# Patient Record
Sex: Female | Born: 1945 | Race: White | Hispanic: No | Smoking: Never smoker
Health system: Southern US, Community
[De-identification: ages and names within clinical notes are randomized; demographics above are authoritative.]

## PROBLEM LIST (undated history)

## (undated) DIAGNOSIS — N6019 Diffuse cystic mastopathy of unspecified breast: Secondary | ICD-10-CM

## (undated) HISTORY — PX: BREAST CYST EXCISION: SHX579

## (undated) HISTORY — PX: BREAST CYST ASPIRATION: SHX578

---

## 2016-03-31 DIAGNOSIS — H524 Presbyopia: Secondary | ICD-10-CM | POA: Diagnosis not present

## 2016-06-30 DIAGNOSIS — Z1231 Encounter for screening mammogram for malignant neoplasm of breast: Secondary | ICD-10-CM | POA: Diagnosis not present

## 2016-08-31 DIAGNOSIS — E782 Mixed hyperlipidemia: Secondary | ICD-10-CM | POA: Diagnosis not present

## 2016-08-31 DIAGNOSIS — I10 Essential (primary) hypertension: Secondary | ICD-10-CM | POA: Diagnosis not present

## 2016-08-31 DIAGNOSIS — E559 Vitamin D deficiency, unspecified: Secondary | ICD-10-CM | POA: Diagnosis not present

## 2016-08-31 DIAGNOSIS — M8580 Other specified disorders of bone density and structure, unspecified site: Secondary | ICD-10-CM | POA: Diagnosis not present

## 2017-03-01 DIAGNOSIS — Z0001 Encounter for general adult medical examination with abnormal findings: Secondary | ICD-10-CM | POA: Diagnosis not present

## 2017-03-01 DIAGNOSIS — E559 Vitamin D deficiency, unspecified: Secondary | ICD-10-CM | POA: Diagnosis not present

## 2017-03-01 DIAGNOSIS — E784 Other hyperlipidemia: Secondary | ICD-10-CM | POA: Diagnosis not present

## 2017-03-01 DIAGNOSIS — I10 Essential (primary) hypertension: Secondary | ICD-10-CM | POA: Diagnosis not present

## 2017-03-01 DIAGNOSIS — N029 Recurrent and persistent hematuria with unspecified morphologic changes: Secondary | ICD-10-CM | POA: Diagnosis not present

## 2017-03-01 DIAGNOSIS — L84 Corns and callosities: Secondary | ICD-10-CM | POA: Diagnosis not present

## 2017-03-03 DIAGNOSIS — M859 Disorder of bone density and structure, unspecified: Secondary | ICD-10-CM | POA: Diagnosis not present

## 2017-03-04 DIAGNOSIS — M779 Enthesopathy, unspecified: Secondary | ICD-10-CM | POA: Diagnosis not present

## 2017-03-04 DIAGNOSIS — M2011 Hallux valgus (acquired), right foot: Secondary | ICD-10-CM | POA: Diagnosis not present

## 2017-03-08 DIAGNOSIS — R948 Abnormal results of function studies of other organs and systems: Secondary | ICD-10-CM | POA: Diagnosis not present

## 2017-03-31 DIAGNOSIS — J339 Nasal polyp, unspecified: Secondary | ICD-10-CM | POA: Diagnosis not present

## 2017-04-05 DIAGNOSIS — H524 Presbyopia: Secondary | ICD-10-CM | POA: Diagnosis not present

## 2017-05-05 DIAGNOSIS — B353 Tinea pedis: Secondary | ICD-10-CM | POA: Diagnosis not present

## 2017-05-05 DIAGNOSIS — I10 Essential (primary) hypertension: Secondary | ICD-10-CM | POA: Diagnosis not present

## 2017-07-01 DIAGNOSIS — Z1231 Encounter for screening mammogram for malignant neoplasm of breast: Secondary | ICD-10-CM | POA: Diagnosis not present

## 2017-11-08 DIAGNOSIS — Z0000 Encounter for general adult medical examination without abnormal findings: Secondary | ICD-10-CM | POA: Diagnosis not present

## 2017-12-19 DIAGNOSIS — E785 Hyperlipidemia, unspecified: Secondary | ICD-10-CM | POA: Diagnosis not present

## 2017-12-19 DIAGNOSIS — R740 Nonspecific elevation of levels of transaminase and lactic acid dehydrogenase [LDH]: Secondary | ICD-10-CM | POA: Diagnosis not present

## 2017-12-19 DIAGNOSIS — I10 Essential (primary) hypertension: Secondary | ICD-10-CM | POA: Diagnosis not present

## 2017-12-19 DIAGNOSIS — J339 Nasal polyp, unspecified: Secondary | ICD-10-CM | POA: Diagnosis not present

## 2018-03-03 ENCOUNTER — Other Ambulatory Visit: Payer: Self-pay | Admitting: Family Medicine

## 2018-03-03 DIAGNOSIS — Z1389 Encounter for screening for other disorder: Secondary | ICD-10-CM | POA: Diagnosis not present

## 2018-03-03 DIAGNOSIS — Z0000 Encounter for general adult medical examination without abnormal findings: Secondary | ICD-10-CM | POA: Diagnosis not present

## 2018-03-03 DIAGNOSIS — Z139 Encounter for screening, unspecified: Secondary | ICD-10-CM

## 2018-03-03 DIAGNOSIS — E785 Hyperlipidemia, unspecified: Secondary | ICD-10-CM | POA: Diagnosis not present

## 2018-03-03 DIAGNOSIS — I10 Essential (primary) hypertension: Secondary | ICD-10-CM | POA: Diagnosis not present

## 2018-04-26 DIAGNOSIS — H02831 Dermatochalasis of right upper eyelid: Secondary | ICD-10-CM | POA: Diagnosis not present

## 2018-04-26 DIAGNOSIS — H2511 Age-related nuclear cataract, right eye: Secondary | ICD-10-CM | POA: Diagnosis not present

## 2018-04-26 DIAGNOSIS — H2512 Age-related nuclear cataract, left eye: Secondary | ICD-10-CM | POA: Diagnosis not present

## 2018-05-31 DIAGNOSIS — J339 Nasal polyp, unspecified: Secondary | ICD-10-CM | POA: Diagnosis not present

## 2018-07-03 ENCOUNTER — Ambulatory Visit: Payer: Self-pay

## 2018-07-05 ENCOUNTER — Ambulatory Visit
Admission: RE | Admit: 2018-07-05 | Discharge: 2018-07-05 | Disposition: A | Discharge status: Home / Self Care | Payer: Medicare Other | Source: Ambulatory Visit | Attending: Family Medicine | Admitting: Family Medicine

## 2018-07-05 ENCOUNTER — Encounter (INDEPENDENT_AMBULATORY_CARE_PROVIDER_SITE_OTHER): Payer: Self-pay

## 2018-07-05 DIAGNOSIS — Z139 Encounter for screening, unspecified: Secondary | ICD-10-CM

## 2018-07-05 DIAGNOSIS — Z1231 Encounter for screening mammogram for malignant neoplasm of breast: Secondary | ICD-10-CM | POA: Diagnosis not present

## 2018-07-05 HISTORY — DX: Diffuse cystic mastopathy of unspecified breast: N60.19

## 2018-07-13 DIAGNOSIS — H2511 Age-related nuclear cataract, right eye: Secondary | ICD-10-CM | POA: Diagnosis not present

## 2018-07-13 DIAGNOSIS — H25811 Combined forms of age-related cataract, right eye: Secondary | ICD-10-CM | POA: Diagnosis not present

## 2018-07-27 DIAGNOSIS — H25812 Combined forms of age-related cataract, left eye: Secondary | ICD-10-CM | POA: Diagnosis not present

## 2018-07-27 DIAGNOSIS — H2512 Age-related nuclear cataract, left eye: Secondary | ICD-10-CM | POA: Diagnosis not present

## 2018-09-04 DIAGNOSIS — E785 Hyperlipidemia, unspecified: Secondary | ICD-10-CM | POA: Diagnosis not present

## 2018-09-04 DIAGNOSIS — I10 Essential (primary) hypertension: Secondary | ICD-10-CM | POA: Diagnosis not present

## 2018-11-29 DIAGNOSIS — Z0000 Encounter for general adult medical examination without abnormal findings: Secondary | ICD-10-CM | POA: Diagnosis not present

## 2019-01-29 DIAGNOSIS — S39012A Strain of muscle, fascia and tendon of lower back, initial encounter: Secondary | ICD-10-CM | POA: Diagnosis not present

## 2019-03-22 DIAGNOSIS — I10 Essential (primary) hypertension: Secondary | ICD-10-CM | POA: Diagnosis not present

## 2019-03-22 DIAGNOSIS — Z0000 Encounter for general adult medical examination without abnormal findings: Secondary | ICD-10-CM | POA: Diagnosis not present

## 2019-03-22 DIAGNOSIS — M8588 Other specified disorders of bone density and structure, other site: Secondary | ICD-10-CM | POA: Diagnosis not present

## 2019-03-22 DIAGNOSIS — E785 Hyperlipidemia, unspecified: Secondary | ICD-10-CM | POA: Diagnosis not present

## 2019-03-26 ENCOUNTER — Other Ambulatory Visit: Payer: Self-pay | Admitting: Physician Assistant

## 2019-03-26 DIAGNOSIS — M8580 Other specified disorders of bone density and structure, unspecified site: Secondary | ICD-10-CM

## 2019-03-27 ENCOUNTER — Other Ambulatory Visit: Payer: Self-pay | Admitting: Family Medicine

## 2019-03-27 DIAGNOSIS — Z1231 Encounter for screening mammogram for malignant neoplasm of breast: Secondary | ICD-10-CM

## 2019-07-10 ENCOUNTER — Other Ambulatory Visit: Payer: Medicare Other

## 2019-07-10 ENCOUNTER — Ambulatory Visit
Admission: RE | Admit: 2019-07-10 | Discharge: 2019-07-10 | Disposition: A | Discharge status: Home / Self Care | Payer: Medicare Other | Source: Ambulatory Visit | Attending: Family Medicine | Admitting: Family Medicine

## 2019-07-10 ENCOUNTER — Other Ambulatory Visit: Payer: Self-pay

## 2019-07-10 DIAGNOSIS — Z1231 Encounter for screening mammogram for malignant neoplasm of breast: Secondary | ICD-10-CM | POA: Diagnosis not present

## 2019-09-11 DIAGNOSIS — Z23 Encounter for immunization: Secondary | ICD-10-CM | POA: Diagnosis not present

## 2019-10-02 DIAGNOSIS — Z1159 Encounter for screening for other viral diseases: Secondary | ICD-10-CM | POA: Diagnosis not present

## 2019-10-02 DIAGNOSIS — E785 Hyperlipidemia, unspecified: Secondary | ICD-10-CM | POA: Diagnosis not present

## 2019-10-02 DIAGNOSIS — I10 Essential (primary) hypertension: Secondary | ICD-10-CM | POA: Diagnosis not present

## 2019-10-02 DIAGNOSIS — M25562 Pain in left knee: Secondary | ICD-10-CM | POA: Diagnosis not present

## 2019-10-09 DIAGNOSIS — Z0000 Encounter for general adult medical examination without abnormal findings: Secondary | ICD-10-CM | POA: Diagnosis not present

## 2019-10-09 DIAGNOSIS — E785 Hyperlipidemia, unspecified: Secondary | ICD-10-CM | POA: Diagnosis not present

## 2019-10-09 DIAGNOSIS — I10 Essential (primary) hypertension: Secondary | ICD-10-CM | POA: Diagnosis not present

## 2020-01-10 ENCOUNTER — Ambulatory Visit: Payer: Medicare Other

## 2020-01-15 ENCOUNTER — Ambulatory Visit: Payer: Medicare Other

## 2020-01-19 ENCOUNTER — Ambulatory Visit: Payer: Medicare Other

## 2020-04-01 DIAGNOSIS — E785 Hyperlipidemia, unspecified: Secondary | ICD-10-CM | POA: Diagnosis not present

## 2020-04-01 DIAGNOSIS — Z0000 Encounter for general adult medical examination without abnormal findings: Secondary | ICD-10-CM | POA: Diagnosis not present

## 2020-04-01 DIAGNOSIS — I10 Essential (primary) hypertension: Secondary | ICD-10-CM | POA: Diagnosis not present

## 2020-04-01 DIAGNOSIS — M8588 Other specified disorders of bone density and structure, other site: Secondary | ICD-10-CM | POA: Diagnosis not present

## 2020-05-26 ENCOUNTER — Other Ambulatory Visit: Payer: Self-pay | Admitting: Family Medicine

## 2020-05-26 DIAGNOSIS — Z1231 Encounter for screening mammogram for malignant neoplasm of breast: Secondary | ICD-10-CM

## 2020-07-15 ENCOUNTER — Other Ambulatory Visit: Payer: Self-pay

## 2020-07-15 ENCOUNTER — Ambulatory Visit
Admission: RE | Admit: 2020-07-15 | Discharge: 2020-07-15 | Disposition: A | Discharge status: Home / Self Care | Payer: Medicare Other | Source: Ambulatory Visit | Attending: Family Medicine | Admitting: Family Medicine

## 2020-07-15 DIAGNOSIS — Z1231 Encounter for screening mammogram for malignant neoplasm of breast: Secondary | ICD-10-CM

## 2020-08-08 DIAGNOSIS — H524 Presbyopia: Secondary | ICD-10-CM | POA: Diagnosis not present

## 2020-10-18 ENCOUNTER — Other Ambulatory Visit: Payer: Self-pay

## 2020-10-18 DIAGNOSIS — Z90710 Acquired absence of both cervix and uterus: Secondary | ICD-10-CM | POA: Diagnosis not present

## 2020-10-18 DIAGNOSIS — R109 Unspecified abdominal pain: Secondary | ICD-10-CM | POA: Insufficient documentation

## 2020-10-18 DIAGNOSIS — N132 Hydronephrosis with renal and ureteral calculous obstruction: Secondary | ICD-10-CM | POA: Diagnosis not present

## 2020-10-18 NOTE — ED Triage Notes (Signed)
Pt is saying she has been having severe pain in her left lower quadrant and has been having blood in urine over 2 weeks now Pt said vomiting. Pt is in significant pain. Pt is going around to her left lower back

## 2020-10-19 ENCOUNTER — Emergency Department (HOSPITAL_COMMUNITY)
Admission: EM | Admit: 2020-10-19 | Discharge: 2020-10-19 | Disposition: A | Discharge status: Home / Self Care | Payer: Medicare Other | Attending: Emergency Medicine | Admitting: Emergency Medicine

## 2020-10-19 ENCOUNTER — Emergency Department (HOSPITAL_COMMUNITY): Payer: Medicare Other

## 2020-10-19 DIAGNOSIS — Z90710 Acquired absence of both cervix and uterus: Secondary | ICD-10-CM | POA: Diagnosis not present

## 2020-10-19 DIAGNOSIS — R109 Unspecified abdominal pain: Secondary | ICD-10-CM

## 2020-10-19 DIAGNOSIS — N132 Hydronephrosis with renal and ureteral calculous obstruction: Secondary | ICD-10-CM | POA: Diagnosis not present

## 2020-10-19 LAB — URINALYSIS, ROUTINE W REFLEX MICROSCOPIC
Bacteria, UA: NONE SEEN
Bilirubin Urine: NEGATIVE
Glucose, UA: 150 mg/dL — AB
Ketones, ur: NEGATIVE mg/dL
Leukocytes,Ua: NEGATIVE
Nitrite: NEGATIVE
Protein, ur: 30 mg/dL — AB
RBC / HPF: >50 RBC/hpf — ABNORMAL HIGH (ref 0–5)
Specific Gravity, Urine: 1.017 (ref 1.005–1.030)
pH: 6 (ref 5.0–8.0)

## 2020-10-19 LAB — CBC
HCT: 44.2 % (ref 36.0–46.0)
Hemoglobin: 14.4 g/dL (ref 12.0–15.0)
MCH: 29.9 pg (ref 26.0–34.0)
MCHC: 32.6 g/dL (ref 30.0–36.0)
MCV: 91.7 fL (ref 80.0–100.0)
Platelets: 260 10*3/uL (ref 150–400)
RBC: 4.82 MIL/uL (ref 3.87–5.11)
RDW: 13.5 % (ref 11.5–15.5)
WBC: 14.6 10*3/uL — ABNORMAL HIGH (ref 4.0–10.5)
nRBC: 0 % (ref 0.0–0.2)

## 2020-10-19 LAB — BASIC METABOLIC PANEL
Anion gap: 10 (ref 5–15)
BUN: 28 mg/dL — ABNORMAL HIGH (ref 8–23)
CO2: 26 mmol/L (ref 22–32)
Calcium: 10.1 mg/dL (ref 8.9–10.3)
Chloride: 104 mmol/L (ref 98–111)
Creatinine, Ser: 1.57 mg/dL — ABNORMAL HIGH (ref 0.44–1.00)
GFR, Estimated: 34 mL/min — ABNORMAL LOW (ref >60)
Glucose, Bld: 163 mg/dL — ABNORMAL HIGH (ref 70–99)
Potassium: 4.7 mmol/L (ref 3.5–5.1)
Sodium: 140 mmol/L (ref 135–145)

## 2020-10-19 MED ORDER — TAMSULOSIN HCL 0.4 MG PO CAPS: 0.4000 mg | ORAL_CAPSULE | Freq: Every day | ORAL | 0 refills | Status: AC

## 2020-10-19 MED ORDER — OXYCODONE-ACETAMINOPHEN 5-325 MG PO TABS
1.0000 | ORAL_TABLET | Freq: Four times a day (QID) | ORAL | 0 refills | Status: AC | PRN
Start: 2020-10-19 — End: 2020-10-22

## 2020-10-19 MED ORDER — OXYCODONE-ACETAMINOPHEN 5-325 MG PO TABS
1.0000 | ORAL_TABLET | ORAL | Status: DC | PRN
  Administered 2020-10-19: 1 via ORAL
  Filled 2020-10-19: qty 1

## 2020-10-19 NOTE — Discharge Instructions (Addendum)
Recommend follow-up with urology and your primary care doctor.  Recommend having your creatinine levels (kidney levels) rechecked by your primary doctor sometime this coming week.  For pain control, can take the prescribed Percocet.  This can make you drowsy and should not be taken while driving.  If you develop uncontrolled pain, vomiting, any fever, return immediately to ER for reassessment.

## 2020-10-19 NOTE — ED Provider Notes (Signed)
[facility] EMERGENCY DEPARTMENT Provider Note   CSN: [id number] Arrival date & time: 10/18/20  2148     History Chief Complaint  Patient presents with  . Flank Pain    Sara Mcgrath is a 74 y.o. female.  Couple weeks ago noted small blood in urine, yesterday started having occasional episodes of severe left-sided abdominal/lower back pain.  Radiating to left abdomen.  Sharp, stabbing pain.  Resolved after receiving Percocet.  Denies prior history of kidney stones.  Allergic to NSAIDs.  No fevers.  HPI     Past Medical History:  Diagnosis Date  . Fibrocystic breast     There are no problems to display for this patient.   Past Surgical History:  Procedure Laterality Date  . BREAST CYST ASPIRATION     2000's. NEG  . BREAST CYST EXCISION Bilateral    2000's. Benign     OB History   No obstetric history on file.     No family history on file.  Social History   Tobacco Use  . Smoking status: Not on file  Substance Use Topics  . Alcohol use: Not on file  . Drug use: Not on file    Home Medications Prior to Admission medications   Medication Sig Start Date End Date Taking? Authorizing Provider  oxyCODONE-acetaminophen (PERCOCET/ROXICET) 5-325 MG tablet Take 1 tablet by mouth every 6 (six) hours as needed for up to 3 days for severe pain. 10/19/20 10/22/20  Milagros Loll, MD  tamsulosin (FLOMAX) 0.4 MG CAPS capsule Take 1 capsule (0.4 mg total) by mouth daily. 10/19/20   Milagros Loll, MD    Allergies    Patient has no allergy information on record.  Review of Systems   Review of Systems  Constitutional: Negative for chills and fever.  HENT: Negative for ear pain and sore throat.   Eyes: Negative for pain and visual disturbance.  Respiratory: Negative for cough and shortness of breath.   Cardiovascular: Negative for chest pain and palpitations.  Gastrointestinal: Negative for abdominal pain and vomiting.  Genitourinary:  Positive for flank pain. Negative for dysuria and hematuria.  Musculoskeletal: Negative for arthralgias and back pain.  Skin: Negative for color change and rash.  Neurological: Negative for seizures and syncope.  All other systems reviewed and are negative.   Physical Exam Updated Vital Signs BP (!) 149/77 (BP Location: Right Arm)   Pulse 74   Temp 98.1 F (36.7 C)   Resp 14   SpO2 99%   Physical Exam Vitals and nursing note reviewed.  Constitutional:      General: She is not in acute distress.    Appearance: She is well-developed.  HENT:     Head: Normocephalic and atraumatic.  Eyes:     Conjunctiva/sclera: Conjunctivae normal.  Cardiovascular:     Rate and Rhythm: Normal rate and regular rhythm.     Heart sounds: No murmur heard.   Pulmonary:     Effort: Pulmonary effort is normal. No respiratory distress.     Breath sounds: Normal breath sounds.  Abdominal:     Palpations: Abdomen is soft.     Tenderness: There is no abdominal tenderness.  Musculoskeletal:     Cervical back: Neck supple.  Skin:    General: Skin is warm and dry.  Neurological:     General: No focal deficit present.     Mental Status: She is alert.  Psychiatric:        Mood and Affect:  Mood normal.     ED Results / Procedures / Treatments   Labs (all labs ordered are listed, but only abnormal results are displayed) Labs Reviewed  URINALYSIS, ROUTINE W REFLEX MICROSCOPIC - Abnormal; Notable for the following components:      Result Value   Glucose, UA 150 (*)    Hgb urine dipstick MODERATE (*)    Protein, ur 30 (*)    RBC / HPF >50 (*)    All other components within normal limits  BASIC METABOLIC PANEL - Abnormal; Notable for the following components:   Glucose, Bld 163 (*)    BUN 28 (*)    Creatinine, Ser 1.57 (*)    GFR, Estimated 34 (*)    All other components within normal limits  CBC - Abnormal; Notable for the following components:   WBC 14.6 (*)    All other components within  normal limits    EKG None  Radiology CT Renal Stone Study  Result Date: 10/19/2020 CLINICAL DATA:  Left lower quadrant flank pain. EXAM: CT ABDOMEN AND PELVIS WITHOUT CONTRAST TECHNIQUE: Multidetector CT imaging of the abdomen and pelvis was performed following the standard protocol without IV contrast. COMPARISON:  None. FINDINGS: Lower chest: There is scarring and atelectasis at the lung bases.The heart size is normal. Hepatobiliary: The liver is normal. Normal gallbladder.There is no biliary ductal dilation. Pancreas: Normal contours without ductal dilatation. No peripancreatic fluid collection. Spleen: Unremarkable. Adrenals/Urinary Tract: --Adrenal glands: Unremarkable. --Right kidney/ureter: No hydronephrosis or radiopaque kidney stones. --Left kidney/ureter: There is moderate left-sided hydronephrosis secondary to an obstructing 5 mm stone in the distal left ureter nearly at the left UVJ (axial series 3, image 74). --Urinary bladder: Unremarkable. Stomach/Bowel: --Stomach/Duodenum: No hiatal hernia or other gastric abnormality. Normal duodenal course and caliber. --Small bowel: Unremarkable. --Colon: There is a large amount of stool throughout the colon. --Appendix: Normal. Vascular/Lymphatic: Normal course and caliber of the major abdominal vessels. --No retroperitoneal lymphadenopathy. --No mesenteric lymphadenopathy. --No pelvic or inguinal lymphadenopathy. Reproductive: Status post hysterectomy. No adnexal mass. Other: No ascites or free air. The abdominal wall is normal. Musculoskeletal. No acute displaced fractures. IMPRESSION: 1. Moderate left-sided hydronephrosis secondary to an obstructing 5 mm stone in the distal left ureter nearly at the left UVJ. 2. Large amount of stool throughout the colon. Electronically Signed   By: Katherine Mantle M.D.   On: 10/19/2020 00:50    Procedures Procedures (including critical care time)  Medications Ordered in ED Medications    oxyCODONE-acetaminophen (PERCOCET/ROXICET) 5-325 MG per tablet 1 tablet (1 tablet Oral Given 10/19/20 0323)    ED Course  I have reviewed the triage vital signs and the nursing notes.  Pertinent labs & imaging results that were available during my care of the patient were reviewed by me and considered in my medical decision making (see chart for details).    MDM Rules/Calculators/A&P                         74 year old lady with flank pain, hematuria.  CT showed left distal 5 mm stone near the UVJ.  When I assessed patient, her symptoms have completely resolved.  Suspect her symptoms were related to this kidney stone.  No fever.  Mild elevation in creatinine though none available to compare for baseline.  Given pain well controlled, not septic appearing, no UTI, believe she is appropriate for discharge and outpatient management.  Recommend she follow-up with urology as well as primary care  doctor for recheck of her creatinine.  After the discussed management above, the patient was determined to be safe for discharge.  The patient was in agreement with this plan and all questions regarding their care were answered.  ED return precautions were discussed and the patient will return to the ED with any significant worsening of condition.    Final Clinical Impression(s) / ED Diagnoses Final diagnoses:  Flank pain    Rx / DC Orders ED Discharge Orders         Ordered    oxyCODONE-acetaminophen (PERCOCET/ROXICET) 5-325 MG tablet  Every 6 hours PRN        10/19/20 0700    tamsulosin (FLOMAX) 0.4 MG CAPS capsule  Daily        10/19/20 0700           Milagros Loll, MD 10/19/20 [phone]

## 2020-10-19 NOTE — ED Notes (Signed)
Patient Alert and oriented to baseline. Stable and ambulatory to baseline. Patient verbalized understanding of the discharge instructions.  Patient belongings were taken by the patient.   

## 2020-10-24 DIAGNOSIS — I10 Essential (primary) hypertension: Secondary | ICD-10-CM | POA: Diagnosis not present

## 2020-10-24 DIAGNOSIS — N200 Calculus of kidney: Secondary | ICD-10-CM | POA: Diagnosis not present

## 2020-10-24 DIAGNOSIS — E785 Hyperlipidemia, unspecified: Secondary | ICD-10-CM | POA: Diagnosis not present

## 2020-12-05 DIAGNOSIS — U071 COVID-19: Secondary | ICD-10-CM | POA: Diagnosis not present

## 2021-02-04 DIAGNOSIS — H3500 Unspecified background retinopathy: Secondary | ICD-10-CM | POA: Diagnosis not present

## 2021-04-14 DIAGNOSIS — E785 Hyperlipidemia, unspecified: Secondary | ICD-10-CM | POA: Diagnosis not present

## 2021-04-14 DIAGNOSIS — M8588 Other specified disorders of bone density and structure, other site: Secondary | ICD-10-CM | POA: Diagnosis not present

## 2021-04-14 DIAGNOSIS — Z0000 Encounter for general adult medical examination without abnormal findings: Secondary | ICD-10-CM | POA: Diagnosis not present

## 2021-04-14 DIAGNOSIS — I10 Essential (primary) hypertension: Secondary | ICD-10-CM | POA: Diagnosis not present

## 2021-05-08 ENCOUNTER — Other Ambulatory Visit: Payer: Self-pay | Admitting: Family Medicine

## 2021-05-08 DIAGNOSIS — Z1231 Encounter for screening mammogram for malignant neoplasm of breast: Secondary | ICD-10-CM

## 2021-07-16 ENCOUNTER — Inpatient Hospital Stay: Admission: RE | Admit: 2021-07-16 | Payer: Medicare Other | Source: Ambulatory Visit

## 2021-07-18 ENCOUNTER — Other Ambulatory Visit: Payer: Self-pay

## 2021-07-18 ENCOUNTER — Ambulatory Visit
Admission: RE | Admit: 2021-07-18 | Discharge: 2021-07-18 | Disposition: A | Discharge status: Home / Self Care | Payer: Medicare Other | Source: Ambulatory Visit | Attending: Family Medicine | Admitting: Family Medicine

## 2021-07-18 DIAGNOSIS — Z1231 Encounter for screening mammogram for malignant neoplasm of breast: Secondary | ICD-10-CM

## 2021-08-07 DIAGNOSIS — Z8709 Personal history of other diseases of the respiratory system: Secondary | ICD-10-CM | POA: Diagnosis not present

## 2021-08-07 DIAGNOSIS — H6123 Impacted cerumen, bilateral: Secondary | ICD-10-CM | POA: Diagnosis not present

## 2021-08-10 DIAGNOSIS — H5212 Myopia, left eye: Secondary | ICD-10-CM | POA: Diagnosis not present

## 2021-10-21 DIAGNOSIS — I10 Essential (primary) hypertension: Secondary | ICD-10-CM | POA: Diagnosis not present

## 2021-10-21 DIAGNOSIS — M25562 Pain in left knee: Secondary | ICD-10-CM | POA: Diagnosis not present

## 2021-10-21 DIAGNOSIS — E785 Hyperlipidemia, unspecified: Secondary | ICD-10-CM | POA: Diagnosis not present

## 2022-05-03 ENCOUNTER — Other Ambulatory Visit: Payer: Self-pay | Admitting: Family Medicine

## 2022-05-03 DIAGNOSIS — Z1231 Encounter for screening mammogram for malignant neoplasm of breast: Secondary | ICD-10-CM

## 2022-05-03 DIAGNOSIS — M8588 Other specified disorders of bone density and structure, other site: Secondary | ICD-10-CM | POA: Diagnosis not present

## 2022-05-03 DIAGNOSIS — E785 Hyperlipidemia, unspecified: Secondary | ICD-10-CM | POA: Diagnosis not present

## 2022-05-03 DIAGNOSIS — Z0000 Encounter for general adult medical examination without abnormal findings: Secondary | ICD-10-CM | POA: Diagnosis not present

## 2022-05-03 DIAGNOSIS — I10 Essential (primary) hypertension: Secondary | ICD-10-CM | POA: Diagnosis not present

## 2022-07-16 DIAGNOSIS — L821 Other seborrheic keratosis: Secondary | ICD-10-CM | POA: Diagnosis not present

## 2022-07-16 DIAGNOSIS — L814 Other melanin hyperpigmentation: Secondary | ICD-10-CM | POA: Diagnosis not present

## 2022-07-16 DIAGNOSIS — D1801 Hemangioma of skin and subcutaneous tissue: Secondary | ICD-10-CM | POA: Diagnosis not present

## 2022-07-16 DIAGNOSIS — D3612 Benign neoplasm of peripheral nerves and autonomic nervous system, upper limb, including shoulder: Secondary | ICD-10-CM | POA: Diagnosis not present

## 2022-07-23 ENCOUNTER — Ambulatory Visit: Payer: Medicare Other

## 2022-08-02 ENCOUNTER — Ambulatory Visit
Admission: RE | Admit: 2022-08-02 | Discharge: 2022-08-02 | Disposition: A | Discharge status: Home / Self Care | Payer: Medicare Other | Source: Ambulatory Visit | Attending: Family Medicine | Admitting: Family Medicine

## 2022-08-02 DIAGNOSIS — Z1231 Encounter for screening mammogram for malignant neoplasm of breast: Secondary | ICD-10-CM

## 2022-08-11 DIAGNOSIS — H5212 Myopia, left eye: Secondary | ICD-10-CM | POA: Diagnosis not present

## 2022-09-09 DIAGNOSIS — J339 Nasal polyp, unspecified: Secondary | ICD-10-CM | POA: Diagnosis not present

## 2022-11-03 DIAGNOSIS — G4700 Insomnia, unspecified: Secondary | ICD-10-CM | POA: Diagnosis not present

## 2022-11-03 DIAGNOSIS — I10 Essential (primary) hypertension: Secondary | ICD-10-CM | POA: Diagnosis not present

## 2022-11-03 DIAGNOSIS — E785 Hyperlipidemia, unspecified: Secondary | ICD-10-CM | POA: Diagnosis not present

## 2022-11-12 DIAGNOSIS — D122 Benign neoplasm of ascending colon: Secondary | ICD-10-CM | POA: Diagnosis not present

## 2022-11-12 DIAGNOSIS — K5730 Diverticulosis of large intestine without perforation or abscess without bleeding: Secondary | ICD-10-CM | POA: Diagnosis not present

## 2022-11-12 DIAGNOSIS — K648 Other hemorrhoids: Secondary | ICD-10-CM | POA: Diagnosis not present

## 2022-11-12 DIAGNOSIS — D120 Benign neoplasm of cecum: Secondary | ICD-10-CM | POA: Diagnosis not present

## 2022-11-12 DIAGNOSIS — D123 Benign neoplasm of transverse colon: Secondary | ICD-10-CM | POA: Diagnosis not present

## 2022-11-12 DIAGNOSIS — Z1211 Encounter for screening for malignant neoplasm of colon: Secondary | ICD-10-CM | POA: Diagnosis not present

## 2022-11-16 DIAGNOSIS — D123 Benign neoplasm of transverse colon: Secondary | ICD-10-CM | POA: Diagnosis not present

## 2022-11-25 IMAGING — MG MM DIGITAL SCREENING BILAT W/ TOMO AND CAD
8 series · 9 of 24 positions shown · non-contrast
Comparison: Previous exam(s).

CLINICAL DATA: Screening.

EXAM:
DIGITAL SCREENING BILATERAL MAMMOGRAM WITH TOMOSYNTHESIS AND CAD
TECHNIQUE: Bilateral screening digital craniocaudal and mediolateral oblique
mammograms were obtained. Bilateral screening digital breast
tomosynthesis was performed. The images were evaluated with
computer-aided detection.

[R MLO synth-2D]
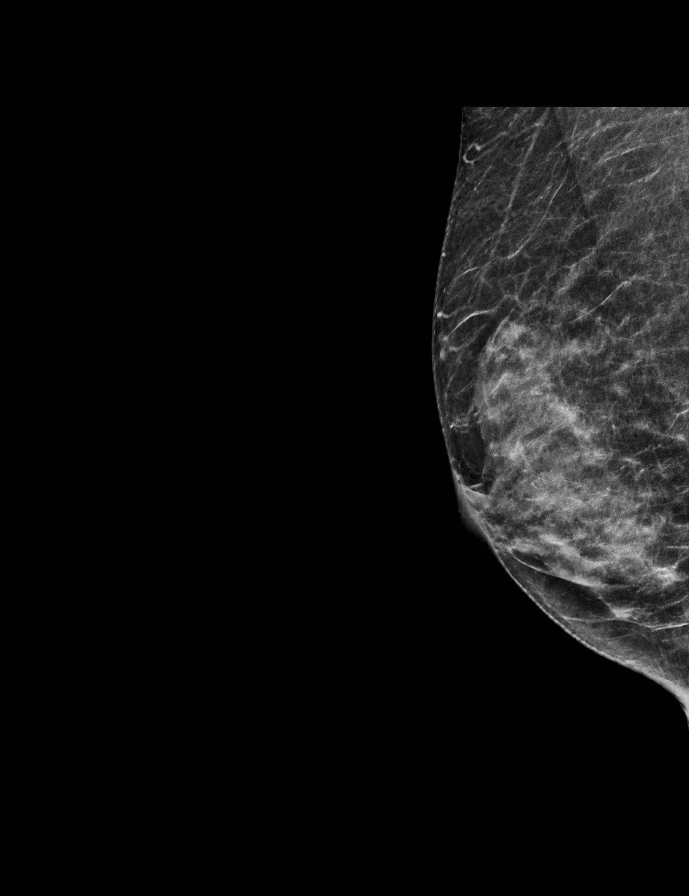

[R CC synth-2D]
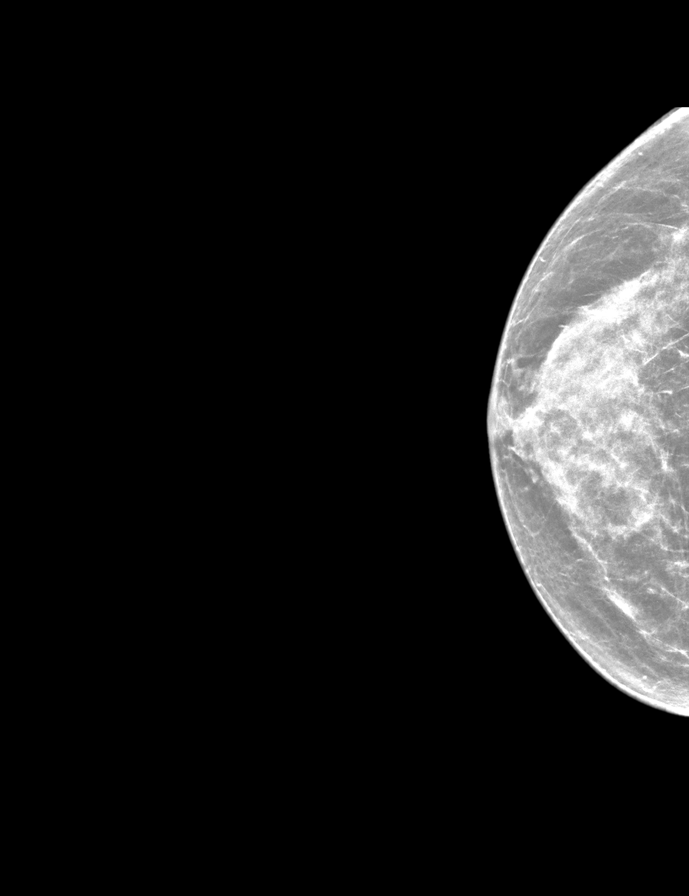

[L MLO synth-2D]
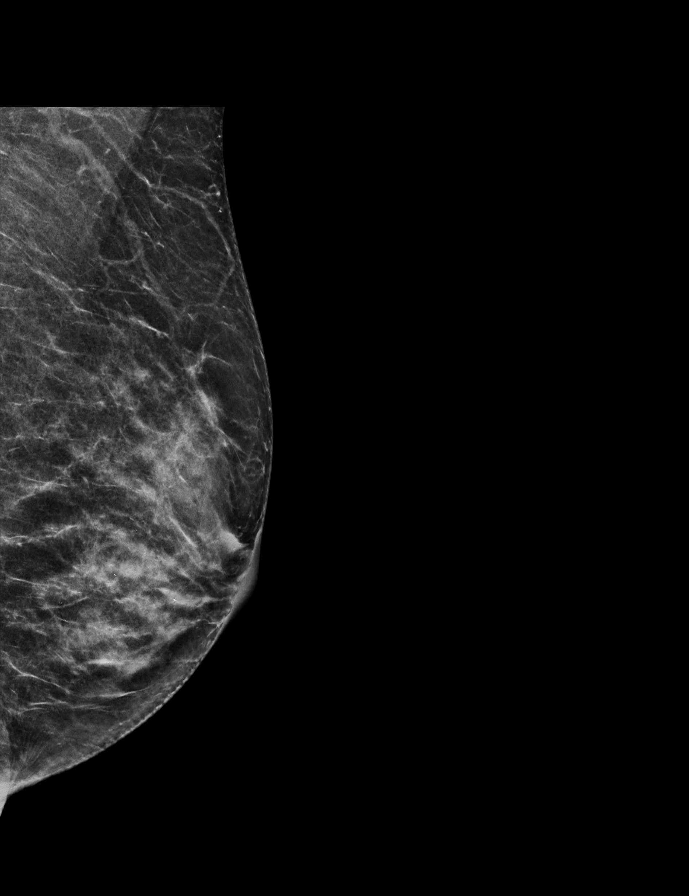

[L CC synth-2D]
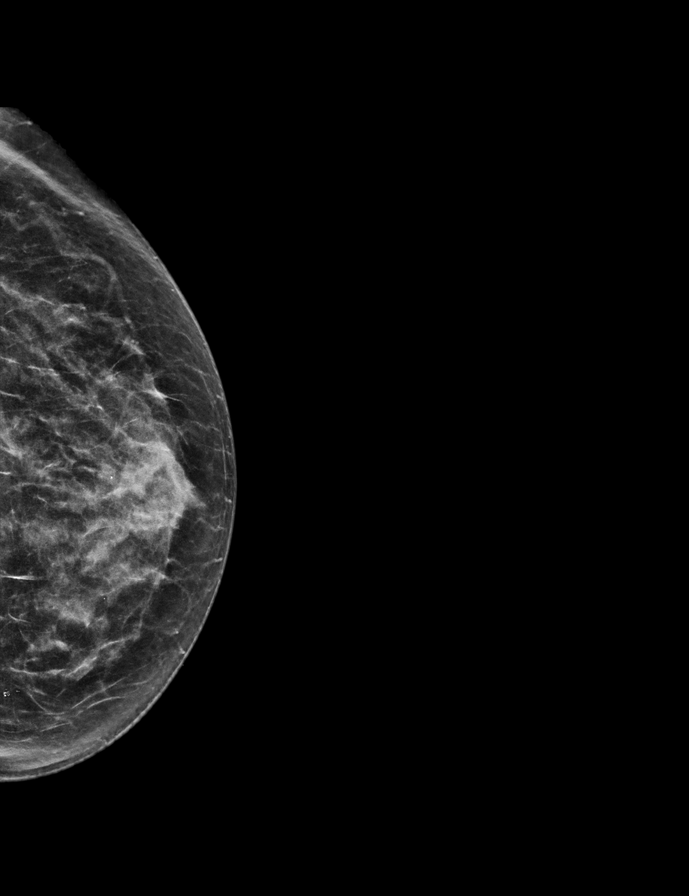

[L MLO tomo · 2 of 50 frames shown]
[frame 17/50]
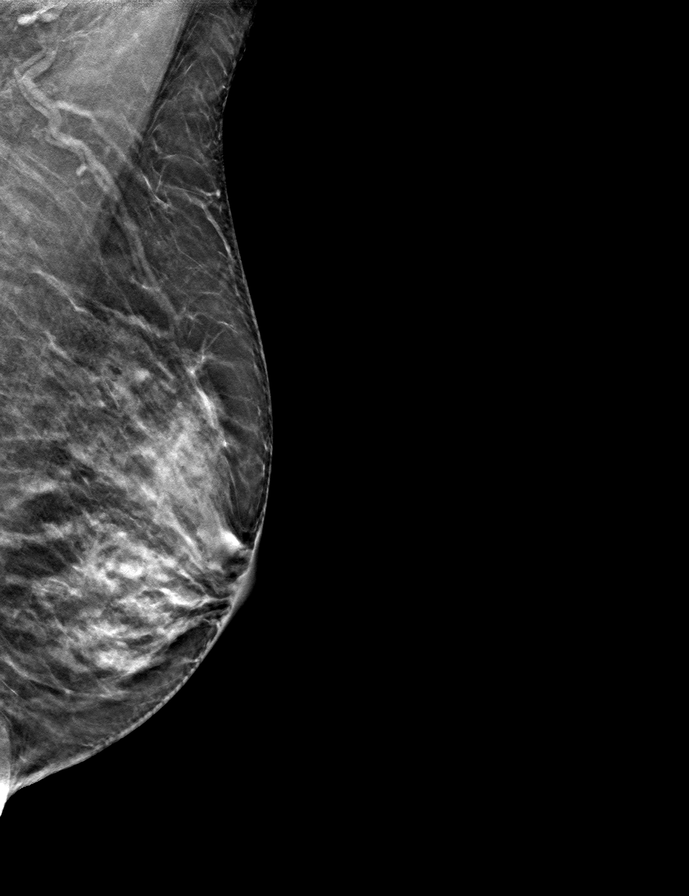
[frame 25/50]
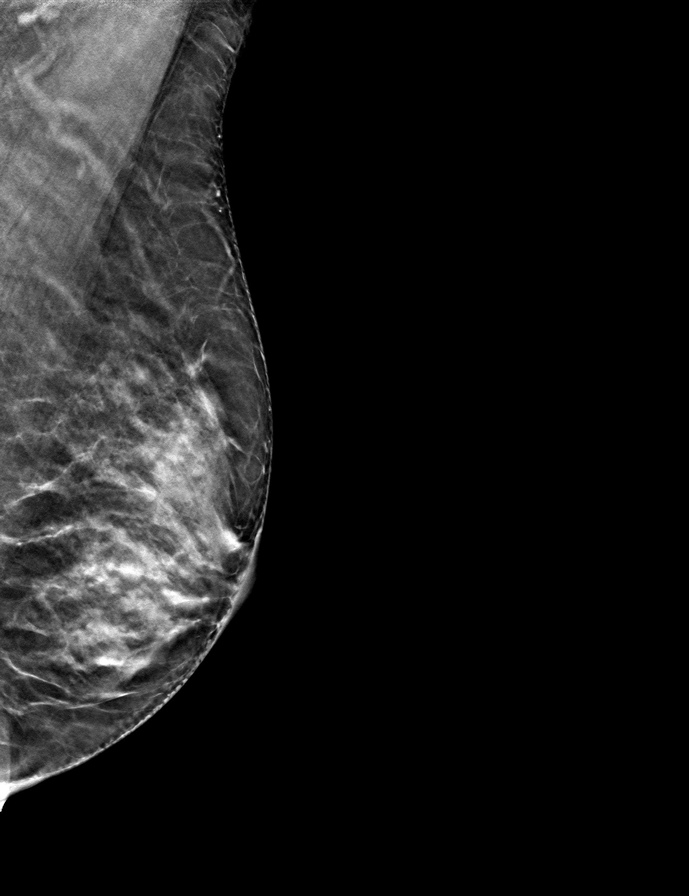

[L CC tomo · tomo slice 28/55.0]
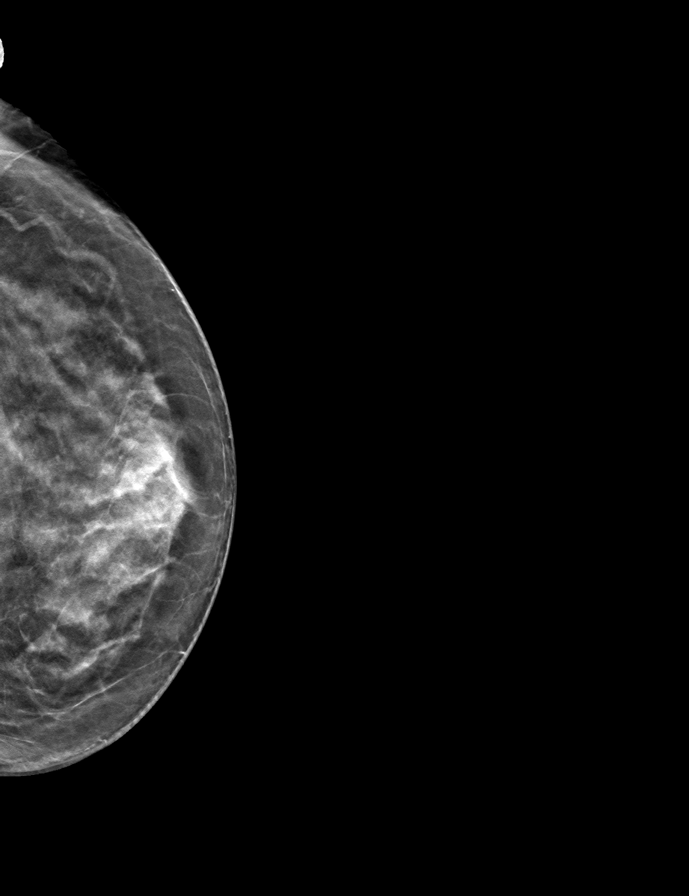

[R MLO tomo · tomo slice 27/52.0]
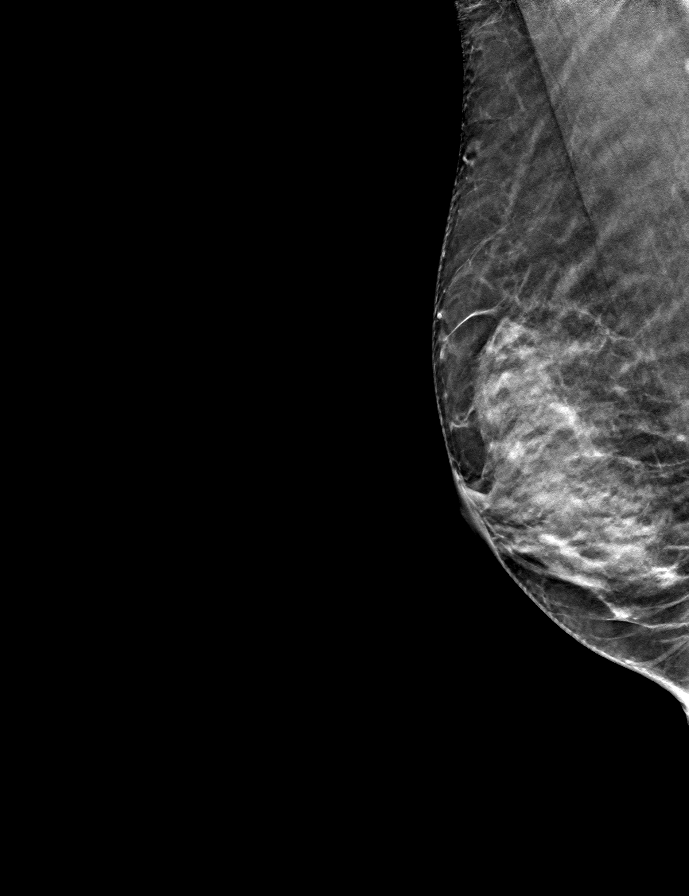

[R CC tomo · tomo slice 27/54.0]
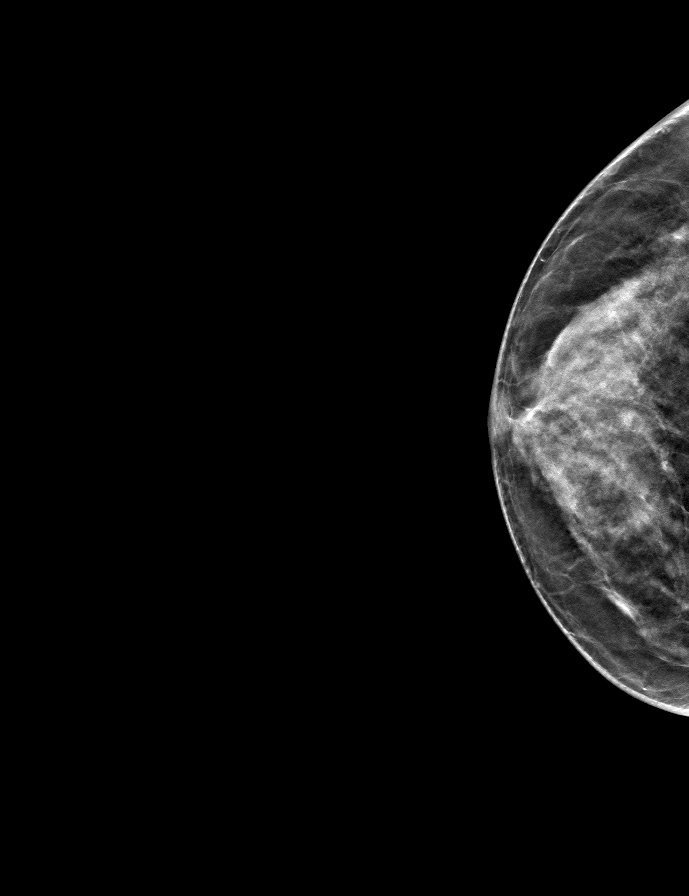

[9 of 24 positions shown; findings below may reference images not displayed]

ACR Breast Density Category c: The breast tissue is heterogeneously
dense, which may obscure small masses.
FINDINGS: There are no findings suspicious for malignancy.
IMPRESSION: No mammographic evidence of malignancy. A result letter of this
screening mammogram will be mailed directly to the patient.

RECOMMENDATION:
Screening mammogram in one year. (Code:Q3-W-BC3)

BI-RADS CATEGORY  1: Negative.

## 2023-03-24 DIAGNOSIS — R509 Fever, unspecified: Secondary | ICD-10-CM | POA: Diagnosis not present

## 2023-03-24 DIAGNOSIS — R0981 Nasal congestion: Secondary | ICD-10-CM | POA: Diagnosis not present

## 2023-03-24 DIAGNOSIS — R5383 Other fatigue: Secondary | ICD-10-CM | POA: Diagnosis not present

## 2023-03-24 DIAGNOSIS — J302 Other seasonal allergic rhinitis: Secondary | ICD-10-CM | POA: Diagnosis not present

## 2023-03-24 DIAGNOSIS — Z03818 Encounter for observation for suspected exposure to other biological agents ruled out: Secondary | ICD-10-CM | POA: Diagnosis not present

## 2023-05-17 ENCOUNTER — Other Ambulatory Visit: Payer: Self-pay | Admitting: Family Medicine

## 2023-05-17 DIAGNOSIS — Z1231 Encounter for screening mammogram for malignant neoplasm of breast: Secondary | ICD-10-CM

## 2023-05-24 DIAGNOSIS — Z1331 Encounter for screening for depression: Secondary | ICD-10-CM | POA: Diagnosis not present

## 2023-05-24 DIAGNOSIS — I10 Essential (primary) hypertension: Secondary | ICD-10-CM | POA: Diagnosis not present

## 2023-05-24 DIAGNOSIS — M8588 Other specified disorders of bone density and structure, other site: Secondary | ICD-10-CM | POA: Diagnosis not present

## 2023-05-24 DIAGNOSIS — E785 Hyperlipidemia, unspecified: Secondary | ICD-10-CM | POA: Diagnosis not present

## 2023-05-24 DIAGNOSIS — Z0000 Encounter for general adult medical examination without abnormal findings: Secondary | ICD-10-CM | POA: Diagnosis not present

## 2023-08-04 ENCOUNTER — Ambulatory Visit
Admission: RE | Admit: 2023-08-04 | Discharge: 2023-08-04 | Disposition: A | Discharge status: Home / Self Care | Payer: Medicare Other | Source: Ambulatory Visit | Attending: Family Medicine | Admitting: Family Medicine

## 2023-08-04 DIAGNOSIS — Z1231 Encounter for screening mammogram for malignant neoplasm of breast: Secondary | ICD-10-CM

## 2023-08-08 ENCOUNTER — Other Ambulatory Visit: Payer: Self-pay | Admitting: Family Medicine

## 2023-08-08 DIAGNOSIS — R928 Other abnormal and inconclusive findings on diagnostic imaging of breast: Secondary | ICD-10-CM

## 2023-08-16 DIAGNOSIS — H5212 Myopia, left eye: Secondary | ICD-10-CM | POA: Diagnosis not present

## 2023-08-17 ENCOUNTER — Ambulatory Visit
Admission: RE | Admit: 2023-08-17 | Discharge: 2023-08-17 | Disposition: A | Discharge status: Home / Self Care | Payer: Medicare Other | Source: Ambulatory Visit | Attending: Family Medicine | Admitting: Family Medicine

## 2023-08-17 DIAGNOSIS — R928 Other abnormal and inconclusive findings on diagnostic imaging of breast: Secondary | ICD-10-CM

## 2023-11-23 DIAGNOSIS — E785 Hyperlipidemia, unspecified: Secondary | ICD-10-CM | POA: Diagnosis not present

## 2023-11-23 DIAGNOSIS — I10 Essential (primary) hypertension: Secondary | ICD-10-CM | POA: Diagnosis not present

## 2024-05-11 DIAGNOSIS — I10 Essential (primary) hypertension: Secondary | ICD-10-CM | POA: Diagnosis not present

## 2024-05-12 DIAGNOSIS — E785 Hyperlipidemia, unspecified: Secondary | ICD-10-CM | POA: Diagnosis not present

## 2024-05-12 DIAGNOSIS — I10 Essential (primary) hypertension: Secondary | ICD-10-CM | POA: Diagnosis not present

## 2024-05-14 DIAGNOSIS — J339 Nasal polyp, unspecified: Secondary | ICD-10-CM | POA: Diagnosis not present

## 2024-06-09 DIAGNOSIS — I10 Essential (primary) hypertension: Secondary | ICD-10-CM | POA: Diagnosis not present

## 2024-06-11 DIAGNOSIS — I10 Essential (primary) hypertension: Secondary | ICD-10-CM | POA: Diagnosis not present

## 2024-06-21 ENCOUNTER — Other Ambulatory Visit: Payer: Self-pay | Admitting: Family Medicine

## 2024-06-21 DIAGNOSIS — Z1231 Encounter for screening mammogram for malignant neoplasm of breast: Secondary | ICD-10-CM

## 2024-06-25 DIAGNOSIS — Z0000 Encounter for general adult medical examination without abnormal findings: Secondary | ICD-10-CM | POA: Diagnosis not present

## 2024-06-25 DIAGNOSIS — Z1331 Encounter for screening for depression: Secondary | ICD-10-CM | POA: Diagnosis not present

## 2024-06-25 DIAGNOSIS — M8588 Other specified disorders of bone density and structure, other site: Secondary | ICD-10-CM | POA: Diagnosis not present

## 2024-06-25 DIAGNOSIS — I10 Essential (primary) hypertension: Secondary | ICD-10-CM | POA: Diagnosis not present

## 2024-06-25 DIAGNOSIS — E785 Hyperlipidemia, unspecified: Secondary | ICD-10-CM | POA: Diagnosis not present

## 2024-07-09 DIAGNOSIS — I10 Essential (primary) hypertension: Secondary | ICD-10-CM | POA: Diagnosis not present

## 2024-07-12 DIAGNOSIS — I10 Essential (primary) hypertension: Secondary | ICD-10-CM | POA: Diagnosis not present

## 2024-07-12 DIAGNOSIS — E785 Hyperlipidemia, unspecified: Secondary | ICD-10-CM | POA: Diagnosis not present

## 2024-08-06 DIAGNOSIS — M25562 Pain in left knee: Secondary | ICD-10-CM | POA: Diagnosis not present

## 2024-08-07 ENCOUNTER — Ambulatory Visit
Admission: RE | Admit: 2024-08-07 | Discharge: 2024-08-07 | Disposition: A | Discharge status: Home / Self Care | Source: Ambulatory Visit | Attending: Family Medicine | Admitting: Family Medicine

## 2024-08-07 DIAGNOSIS — Z1231 Encounter for screening mammogram for malignant neoplasm of breast: Secondary | ICD-10-CM

## 2024-08-16 DIAGNOSIS — H524 Presbyopia: Secondary | ICD-10-CM | POA: Diagnosis not present

## 2024-09-26 DIAGNOSIS — M170 Bilateral primary osteoarthritis of knee: Secondary | ICD-10-CM | POA: Diagnosis not present

## 2024-09-26 DIAGNOSIS — G8929 Other chronic pain: Secondary | ICD-10-CM | POA: Diagnosis not present

## 2024-09-26 DIAGNOSIS — M1712 Unilateral primary osteoarthritis, left knee: Secondary | ICD-10-CM | POA: Diagnosis not present

## 2024-11-27 NOTE — Progress Notes (Unsigned)
 Cardiology Office Note:  .   Date:  11/28/2024  ID:  [name], DOB 08-21-1946, MRN [id number] PCP: Sun, Vyvyan, MD  [facility] Providers Cardiologist:  None   History of Present Illness: .    Chief Complaint  Patient presents with   Pre-op Exam    Sara Mcgrath is a 78 y.o. female with below history who presents for the evaluation of preoperative assessment at the request of Sun, Vyvyan, MD.   History of Present Illness   Sara Mcgrath is a 78 year old female who presents for a preoperative evaluation for knee replacement surgery.  She is scheduled for knee replacement surgery on February 24th. She maintains an active lifestyle, using exercise machines twice a week and attending a 'move better, feel better' class. She enjoys walking but is limited by knee pain, able to walk a mile but experiences knee discomfort. No chest pain or trouble breathing, and she reports occasional leg swelling.  She has a history of mitral valve prolapse diagnosed many years ago by an internal medicine doctor with a cardiology background. She has never seen a cardiologist and has not had a heart ultrasound to confirm the diagnosis. She used to take amoxicillin prophylactically for dental work due to this condition.  Her medical history includes hypertension and hyperlipidemia, for which she is on medication. She also has nasal polyps. She denies any history of diabetes, heart attack, or stroke. 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  In terms of family history, her brother, who is 22 years old, has atrial fibrillation and atrial flutter. She does not smoke and only consumes alcohol occasionally. She is divorced, has one daughter, and two grandchildren. 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 She resides in an independent living facility.            Problem List HTN HLD -T chol 160, HDL 79, LDL 63, TG 106 Mitral valve prolapse     ROS: All other ROS reviewed and negative. Pertinent positives noted in the HPI.     Studies Reviewed: SABRA   EKG Interpretation Date/Time:  Wednesday November 28 2024 09:29:09 EST Ventricular Rate:  97 PR Interval:  148 QRS Duration:  90 QT Interval:  366 QTC Calculation: 464 R Axis:   -8  Text Interpretation: Normal sinus rhythm Normal ECG Confirmed by Barbaraann Kotyk [phone]) on 11/28/2024 9:30:37 AM   Physical Exam:   VS:  BP 132/78 (BP Location: Left Arm, Patient Position: Sitting)   Pulse 97   Ht 5' 8 (1.727 m)   Wt 151 lb 3.2 oz (68.6 kg)   SpO2 90%   BMI 22.99 kg/m    Wt Readings from Last 3 Encounters:  11/28/24 151 lb 3.2 oz (68.6 kg)    GEN: Well nourished, well developed in no acute distress NECK: No JVD; No carotid bruits CARDIAC: RRR, no murmurs, rubs, gallops RESPIRATORY:  Clear to auscultation without rales, wheezing or rhonchi  ABDOMEN: Soft, non-tender, non-distended EXTREMITIES:  No edema; No deformity  ASSESSMENT AND PLAN: .   Assessment and Plan    Preoperative cardiovascular evaluation Can complete > 4 METS without limitations. EKG normal.  - Proceed with surgery as planned on February 24th. - No further cardiac testing needed.   Mitral valve prolapse Potential mitral valve prolapse with no current symptoms. Echocardiogram needed for confirmation and further assessment. -  Ordered echocardiogram to assess mitral valve prolapse. - Will follow up based on echocardiogram results. - Echo should not delay surgery.   Hypertension Stable on current medication regimen. - Continue current antihypertensive regimen.                Follow-up: Return in about 1 year (around 11/28/2025).  Signed, Darryle DASEN. Barbaraann, MD, [facility]  [facility]  [address] [place], [place] [address] [phone]  9:43 AM

## 2024-11-28 ENCOUNTER — Ambulatory Visit: Attending: Cardiovascular Disease | Admitting: Cardiovascular Disease

## 2024-11-28 ENCOUNTER — Encounter: Payer: Self-pay | Admitting: Cardiovascular Disease

## 2024-11-28 VITALS — BP 132/78 | HR 97 | Ht 68.0 in | Wt 151.2 lb

## 2024-11-28 DIAGNOSIS — Z01810 Encounter for preprocedural cardiovascular examination: Secondary | ICD-10-CM | POA: Diagnosis not present

## 2024-11-28 DIAGNOSIS — I341 Nonrheumatic mitral (valve) prolapse: Secondary | ICD-10-CM | POA: Diagnosis not present

## 2024-11-28 NOTE — Patient Instructions (Signed)
 Medication Instructions:  Your physician recommends that you continue on your current medications as directed. Please refer to the Current Medication list given to you today.  *If you need a refill on your cardiac medications before your next appointment, please call your pharmacy*  Testing/Procedures: Your physician has requested that you have an echocardiogram. Echocardiography is a painless test that uses sound waves to create images of your heart. It provides your doctor with information about the size and shape of your heart and how well your heart's chambers and valves are working. This procedure takes approximately one hour. There are no restrictions for this procedure. Please do NOT wear cologne, perfume, aftershave, or lotions (deodorant is allowed). Please arrive 15 minutes prior to your appointment time.  Please note: We ask at that you not bring children with you during ultrasound (echo/ vascular) testing. Due to room size and safety concerns, children are not allowed in the ultrasound rooms during exams. Our front office staff cannot provide observation of children in our lobby area while testing is being conducted. An adult accompanying a patient to their appointment will only be allowed in the ultrasound room at the discretion of the ultrasound technician under special circumstances. We apologize for any inconvenience.    Follow-Up: At [facility], you and your health needs are our priority.  As part of our continuing mission to provide you with exceptional heart care, our providers are all part of one team.  This team includes your primary Cardiologist (physician) and Advanced Practice Providers or APPs (Physician Assistants and Nurse Practitioners) who all work together to provide you with the care you need, when you need it.  Your next appointment:   We will see you on an as needed basis.    Provider:   Dr. Barbaraann

## 2024-11-29 ENCOUNTER — Telehealth (HOSPITAL_BASED_OUTPATIENT_CLINIC_OR_DEPARTMENT_OTHER): Payer: Self-pay | Admitting: *Deleted

## 2024-11-29 NOTE — Telephone Encounter (Signed)
° °  Patient Name: Sara Mcgrath  DOB: [birth year] MRN: [id number]  Primary Cardiologist: None  Chart reviewed as part of pre-operative protocol coverage. Given past medical history and time since last visit, based on ACC/AHA guidelines, Sara Mcgrath is at acceptable risk for the planned procedure without further cardiovascular testing. See Dr. Rosslyn office visit note from 11/28/2024.  The patient was advised that if she develops new symptoms prior to surgery to contact our office to arrange for a follow-up visit, and she verbalized understanding.  I will route this recommendation to the requesting party via Epic fax function and remove from pre-op pool.  Please call with questions.  Demarie Hyneman E Keriann Rankin, NP 11/29/2024, 2:31 PM

## 2024-11-29 NOTE — Telephone Encounter (Signed)
° °  Pre-operative Risk Assessment    Patient Name: Sara Mcgrath  DOB: Oct 05, 1946 MRN: [id number]   Date of last office visit: 11/28/24 DR. O'NEIL Date of next office visit: NONE   Request for Surgical Clearance    Procedure:  LEFT TOTAL KNEE ARTHROPLASTY  Date of Surgery:  Clearance 02/05/25                                Surgeon:  DR. MATTHEW OLIN Surgeon's Group or Practice Name:  [facility] Phone number:  [phone] [facility] Fax number:  [phone]   Type of Clearance Requested:   - Medical    Type of Anesthesia:  Spinal   Additional requests/questions:    Signed, Breanah Faddis   11/29/2024, 10:00 AM

## 2025-01-09 ENCOUNTER — Ambulatory Visit (HOSPITAL_COMMUNITY)
Admission: RE | Admit: 2025-01-09 | Discharge: 2025-01-09 | Disposition: A | Discharge status: Home / Self Care | Source: Ambulatory Visit | Attending: Cardiovascular Disease | Admitting: Cardiovascular Disease

## 2025-01-09 DIAGNOSIS — Z01810 Encounter for preprocedural cardiovascular examination: Secondary | ICD-10-CM | POA: Diagnosis present

## 2025-01-09 DIAGNOSIS — I341 Nonrheumatic mitral (valve) prolapse: Secondary | ICD-10-CM | POA: Insufficient documentation

## 2025-01-09 LAB — ECHOCARDIOGRAM COMPLETE
Area-P 1/2: 3.77 cm2
S' Lateral: 2.1 cm

## 2025-01-10 ENCOUNTER — Ambulatory Visit: Payer: Self-pay | Admitting: Cardiovascular Disease

## 2025-01-24 ENCOUNTER — Encounter (HOSPITAL_COMMUNITY): Admission: RE | Admit: 2025-01-24

## 2025-02-05 ENCOUNTER — Ambulatory Visit (HOSPITAL_COMMUNITY): Admit: 2025-02-05 | Admitting: Orthopedic Surgery

## 2025-02-05 SURGERY — ARTHROPLASTY, KNEE, TOTAL
Anesthesia: Spinal | Site: Knee | Laterality: Left
